# Patient Record
Sex: Female | Born: 1955 | Race: White | Hispanic: No | Marital: Single | Smoking: Current every day smoker
Health system: Southern US, Community
[De-identification: ages and names within clinical notes are randomized; demographics above are authoritative.]

## PROBLEM LIST (undated history)

## (undated) DIAGNOSIS — J449 Chronic obstructive pulmonary disease, unspecified: Secondary | ICD-10-CM

## (undated) DIAGNOSIS — I10 Essential (primary) hypertension: Secondary | ICD-10-CM

## (undated) HISTORY — PX: BACK SURGERY: SHX140

## (undated) HISTORY — PX: APPENDECTOMY: SHX54

---

## 2017-07-25 ENCOUNTER — Encounter (HOSPITAL_BASED_OUTPATIENT_CLINIC_OR_DEPARTMENT_OTHER): Payer: Self-pay | Admitting: Emergency Medicine

## 2017-07-25 ENCOUNTER — Emergency Department (HOSPITAL_BASED_OUTPATIENT_CLINIC_OR_DEPARTMENT_OTHER): Payer: Self-pay

## 2017-07-25 ENCOUNTER — Other Ambulatory Visit: Payer: Self-pay

## 2017-07-25 ENCOUNTER — Emergency Department (HOSPITAL_BASED_OUTPATIENT_CLINIC_OR_DEPARTMENT_OTHER)
Admission: EM | Admit: 2017-07-25 | Discharge: 2017-07-25 | Disposition: A | Discharge status: Home / Self Care | Payer: Self-pay | Attending: Emergency Medicine | Admitting: Emergency Medicine

## 2017-07-25 DIAGNOSIS — I10 Essential (primary) hypertension: Secondary | ICD-10-CM | POA: Insufficient documentation

## 2017-07-25 DIAGNOSIS — F17210 Nicotine dependence, cigarettes, uncomplicated: Secondary | ICD-10-CM | POA: Insufficient documentation

## 2017-07-25 DIAGNOSIS — K5792 Diverticulitis of intestine, part unspecified, without perforation or abscess without bleeding: Secondary | ICD-10-CM | POA: Insufficient documentation

## 2017-07-25 DIAGNOSIS — J449 Chronic obstructive pulmonary disease, unspecified: Secondary | ICD-10-CM | POA: Insufficient documentation

## 2017-07-25 HISTORY — DX: Essential (primary) hypertension: I10

## 2017-07-25 HISTORY — DX: Chronic obstructive pulmonary disease, unspecified: J44.9

## 2017-07-25 LAB — COMPREHENSIVE METABOLIC PANEL
ALK PHOS: 69 U/L (ref 38–126)
ALT: 8 U/L — AB (ref 14–54)
AST: 13 U/L — ABNORMAL LOW (ref 15–41)
Albumin: 3.1 g/dL — ABNORMAL LOW (ref 3.5–5.0)
Anion gap: 6 (ref 5–15)
BUN: 11 mg/dL (ref 6–20)
CALCIUM: 8.2 mg/dL — AB (ref 8.9–10.3)
CO2: 23 mmol/L (ref 22–32)
CREATININE: 0.71 mg/dL (ref 0.44–1.00)
Chloride: 102 mmol/L (ref 101–111)
GFR calc non Af Amer: >60 mL/min (ref >60)
Glucose, Bld: 107 mg/dL — ABNORMAL HIGH (ref 65–99)
Potassium: 3.6 mmol/L (ref 3.5–5.1)
SODIUM: 131 mmol/L — AB (ref 135–145)
Total Bilirubin: 0.7 mg/dL (ref 0.3–1.2)
Total Protein: 6.2 g/dL — ABNORMAL LOW (ref 6.5–8.1)

## 2017-07-25 LAB — URINALYSIS, ROUTINE W REFLEX MICROSCOPIC
Bilirubin Urine: NEGATIVE
Glucose, UA: NEGATIVE mg/dL
Hgb urine dipstick: NEGATIVE
Ketones, ur: NEGATIVE mg/dL
LEUKOCYTES UA: NEGATIVE
NITRITE: NEGATIVE
Protein, ur: NEGATIVE mg/dL
pH: 6 (ref 5.0–8.0)

## 2017-07-25 LAB — CBC WITH DIFFERENTIAL/PLATELET
Basophils Absolute: 0 10*3/uL (ref 0.0–0.1)
Basophils Relative: 0 %
EOS ABS: 0.1 10*3/uL (ref 0.0–0.7)
Eosinophils Relative: 0 %
HCT: 45.7 % (ref 36.0–46.0)
Hemoglobin: 15.6 g/dL — ABNORMAL HIGH (ref 12.0–15.0)
LYMPHS ABS: 1.8 10*3/uL (ref 0.7–4.0)
LYMPHS PCT: 11 %
MCH: 30.6 pg (ref 26.0–34.0)
MCHC: 34.1 g/dL (ref 30.0–36.0)
MCV: 89.8 fL (ref 78.0–100.0)
Monocytes Absolute: 1.5 10*3/uL — ABNORMAL HIGH (ref 0.1–1.0)
Monocytes Relative: 9 %
NEUTROS PCT: 80 %
Neutro Abs: 13.4 10*3/uL — ABNORMAL HIGH (ref 1.7–7.7)
Platelets: 286 10*3/uL (ref 150–400)
RBC: 5.09 MIL/uL (ref 3.87–5.11)
RDW: 11.9 % (ref 11.5–15.5)
WBC: 16.8 10*3/uL — AB (ref 4.0–10.5)

## 2017-07-25 MED ORDER — CIPROFLOXACIN HCL 500 MG PO TABS
500.0000 mg | ORAL_TABLET | Freq: Once | ORAL | Status: AC
  Administered 2017-07-25: 500 mg via ORAL
  Filled 2017-07-25: qty 1

## 2017-07-25 MED ORDER — METRONIDAZOLE 500 MG PO TABS: 500.0000 mg | ORAL_TABLET | Freq: Two times a day (BID) | ORAL | 0 refills | Status: AC

## 2017-07-25 MED ORDER — MORPHINE SULFATE (PF) 4 MG/ML IV SOLN
4.0000 mg | Freq: Once | INTRAVENOUS | Status: AC
  Administered 2017-07-25: 4 mg via INTRAVENOUS
  Filled 2017-07-25: qty 1

## 2017-07-25 MED ORDER — IOPAMIDOL (ISOVUE-300) INJECTION 61%
100.0000 mL | Freq: Once | INTRAVENOUS | Status: AC | PRN
  Administered 2017-07-25: 100 mL via INTRAVENOUS

## 2017-07-25 MED ORDER — CIPROFLOXACIN HCL 500 MG PO TABS: 500.0000 mg | ORAL_TABLET | Freq: Two times a day (BID) | ORAL | 0 refills | Status: AC

## 2017-07-25 MED ORDER — METRONIDAZOLE 500 MG PO TABS
500.0000 mg | ORAL_TABLET | Freq: Once | ORAL | Status: AC
  Administered 2017-07-25: 500 mg via ORAL
  Filled 2017-07-25: qty 1

## 2017-07-25 MED ORDER — SODIUM CHLORIDE 0.9 % IV BOLUS (SEPSIS)
1000.0000 mL | Freq: Once | INTRAVENOUS | Status: AC
  Administered 2017-07-25: 1000 mL via INTRAVENOUS

## 2017-07-25 MED ORDER — HYDROCODONE-ACETAMINOPHEN 5-325 MG PO TABS: 1.0000 | ORAL_TABLET | Freq: Four times a day (QID) | ORAL | 0 refills | Status: AC | PRN

## 2017-07-25 NOTE — ED Triage Notes (Signed)
Pt reports LLQ abd pain x 2 days. Has been taking super beets dietary supplement and unsure if related. Sent from [facility].

## 2017-07-25 NOTE — ED Provider Notes (Signed)
[facility] EMERGENCY DEPARTMENT Provider Note   CSN: [id number] Arrival date & time: 07/25/17  1831     History   Chief Complaint Chief Complaint  Patient presents with  . Abdominal Pain    HPI Lisa Vance is a 61 y.o. female history of COPD, hypertension here presenting with left lower quadrant pain for the last 2 days.  Patient states that several days ago she started super beets supplement and had some LLQ pain.  Dates that pain is crampy with no radiation.  Associated with some nausea but no vomiting.  This morning she developed a fever 101 at home.  To urgent care was noted to have severe left lower quadrant tenderness and was sent here for evaluation.  Denies any urinary symptoms but was noted to have some microscopic hematuria at urgent care.  No history of kidney stones or diverticulitis.   The history is provided by the patient.    Past Medical History:  Diagnosis Date  . COPD (chronic obstructive pulmonary disease) (HCC)   . Hypertension     There are no active problems to display for this patient.   Past Surgical History:  Procedure Laterality Date  . APPENDECTOMY    . BACK SURGERY      OB History    No data available       Home Medications    Prior to Admission medications   Not on File    Family History No family history on file.  Social History Social History   Tobacco Use  . Smoking status: Current Every Day Smoker  . Smokeless tobacco: Never Used  Substance Use Topics  . Alcohol use: No    Frequency: Never  . Drug use: No     Allergies   Patient has no known allergies.   Review of Systems Review of Systems  Gastrointestinal: Positive for abdominal pain.  All other systems reviewed and are negative.    Physical Exam Updated Vital Signs BP 127/86 (BP Location: Left Arm)   Pulse (!) 107   Temp 99 F (37.2 C) (Oral)   Resp 18   Ht 5\' 2"  (1.575 m)   Wt 65.8 kg (145 lb)   SpO2 100%   BMI 26.52 kg/m    Physical Exam  Constitutional: She appears well-developed.  HENT:  Head: Normocephalic.  MM slightly dry   Eyes: Pupils are equal, round, and reactive to light.  Cardiovascular: Normal rate, regular rhythm and normal heart sounds.  Pulmonary/Chest: Effort normal and breath sounds normal.  Minimal wheezing, no crackles   Abdominal: Normal appearance and bowel sounds are normal. There is tenderness in the left lower quadrant.  LLQ tenderness   Neurological: She is alert.  Skin: Skin is warm.  Psychiatric: She has a normal mood and affect.  Nursing note and vitals reviewed.    ED Treatments / Results  Labs (all labs ordered are listed, but only abnormal results are displayed) Labs Reviewed  URINALYSIS, ROUTINE W REFLEX MICROSCOPIC - Abnormal; Notable for the following components:      Result Value   Specific Gravity, Urine <1.005 (*)    All other components within normal limits  CBC WITH DIFFERENTIAL/PLATELET - Abnormal; Notable for the following components:   WBC 16.8 (*)    Hemoglobin 15.6 (*)    Neutro Abs 13.4 (*)    Monocytes Absolute 1.5 (*)    All other components within normal limits  COMPREHENSIVE METABOLIC PANEL - Abnormal; Notable for the following components:  Sodium 131 (*)    Glucose, Bld 107 (*)    Calcium 8.2 (*)    Total Protein 6.2 (*)    Albumin 3.1 (*)    AST 13 (*)    ALT 8 (*)    All other components within normal limits    EKG  EKG Interpretation None       Radiology Ct Abdomen Pelvis W Contrast  Result Date: 07/25/2017 CLINICAL DATA:  Left lower quadrant pain x1 week. EXAM: CT ABDOMEN AND PELVIS WITH CONTRAST TECHNIQUE: Multidetector CT imaging of the abdomen and pelvis was performed using the standard protocol following bolus administration of intravenous contrast. CONTRAST:  100mL ISOVUE-300 IOPAMIDOL (ISOVUE-300) INJECTION 61% COMPARISON:  None. FINDINGS: Lower chest: Normal heart size.  Clear lung bases. Hepatobiliary: No focal liver  abnormality is seen. No gallstones, gallbladder wall thickening, or biliary dilatation. There appears to be some minimal dependent biliary sludge within the gallbladder accounting for faint hyperdensities noted within. Pancreas: Unremarkable. No pancreatic ductal dilatation or surrounding inflammatory changes. Spleen: Normal in size without focal abnormality. Adrenals/Urinary Tract: Normal bilateral adrenal glands. Mild cortical scarring within both lower poles and left interpolar aspect of the kidneys. No obstructive uropathy. Tiny too small to characterize hypodensities may reflect small renal cysts bilaterally. No hydroureteronephrosis. Normal bladder. Stomach/Bowel: Pericolonic inflammation surrounding colonic diverticula along the distal descending and proximal sigmoid colon in keeping with acute diverticulitis is noted. No abscess, free air nor bowel obstruction. The stomach and small intestine are unremarkable. Appendectomy by report. Vascular/Lymphatic: Aortoiliac atherosclerosis.  No lymphadenopathy. Reproductive: Uterus and bilateral adnexa are visualized. A few benign-appearing calcifications are noted about the ovaries. Other: No abdominal wall hernia or abnormality. Trace free fluid in the cul-de-sac likely physiologic. Musculoskeletal: Multilevel degenerative disc space narrowing and posterior marginal osteophytes along the lumbar spine consistent lumbar spondylosis. No acute osseous abnormality. IMPRESSION: 1. Acute distal descending and proximal sigmoid diverticulitis without complicating features. Mild-to-moderate pericolonic fatty inflammation is noted. 2. Bilateral renal cortical scarring without acute abnormality. 3. Minimal biliary sludge within the gallbladder without secondary signs of acute cholecystitis. 4. Lumbar spondylosis. Electronically Signed   By: Tollie Ethavid  Kwon M.D.   On: 07/25/2017 20:24    Procedures Procedures (including critical care time)  Medications Ordered in  ED Medications  ciprofloxacin (CIPRO) tablet 500 mg (not administered)  metroNIDAZOLE (FLAGYL) tablet 500 mg (not administered)  sodium chloride 0.9 % bolus 1,000 mL (1,000 mLs Intravenous New Bag/Given 07/25/17 1929)  morphine 4 MG/ML injection 4 mg (4 mg Intravenous Given 07/25/17 1929)  iopamidol (ISOVUE-300) 61 % injection 100 mL (100 mLs Intravenous Contrast Given 07/25/17 2000)     Initial Impression / Assessment and Plan / ED Course  I have reviewed the triage vital signs and the nursing notes.  Pertinent labs & imaging results that were available during my care of the patient were reviewed by me and considered in my medical decision making (see chart for details).    Lisa Vance is a 61 y.o. female here with LLQ pain for several days, fever. Likely diverticulitis. Will get labs, UA, CT ab/pel.   8:57 PM WBC 16. CT showed uncomplicated diverticulitis. Given cipro/flagyl, will refer to GI for colonoscopy. Will give vicodin for pain.     Final Clinical Impressions(s) / ED Diagnoses   Final diagnoses:  None    ED Discharge Orders    None       Charlynne PanderYao, David Hsienta, MD 07/25/17 [phone]

## 2017-07-25 NOTE — Discharge Instructions (Signed)
Take cipro, flagyl for diverticulitis.   Take motrin for pain.   Take vicodin for severe pain.   See your doctor. Consider colonoscopy with GI doctor   Return to ER if you have worse abdominal pain, vomiting, fever.

## 2019-08-07 IMAGING — CT CT ABD-PELV W/ CM
2 of 5 series · 16 of 46 positions shown, 18 images · IV contrast (iopamidol)
Comparison: None.

CLINICAL DATA: Left lower quadrant pain x1 week.

EXAM:
CT ABDOMEN AND PELVIS WITH CONTRAST
TECHNIQUE: Multidetector CT imaging of the abdomen and pelvis was performed
using the standard protocol following bolus administration of
intravenous contrast.
CONTRAST:  100mL PNFLSC-XNN IOPAMIDOL (PNFLSC-XNN) INJECTION 61%

[Series 2: axial st · axial · 0.89mm/px · z∈[-430,-60]mm · 13 of 84 slices shown, 15 images]
[im 5/84  soft-tissue]
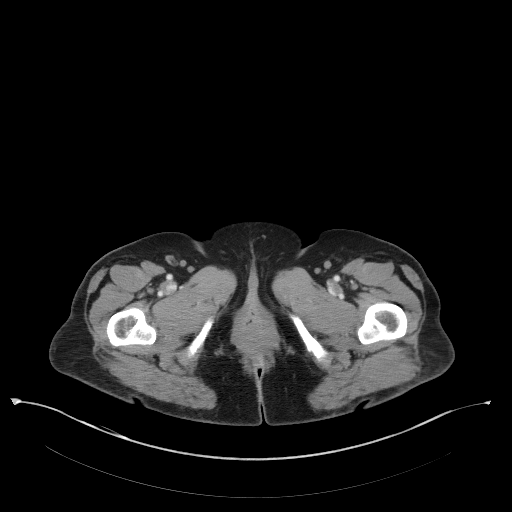
[im 5/84  bone]
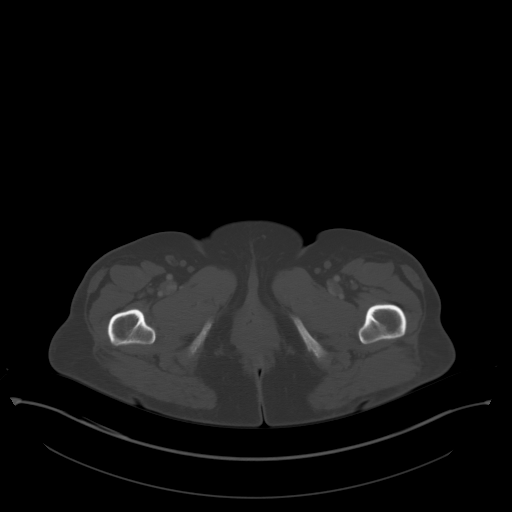
[im 14/84  soft-tissue]
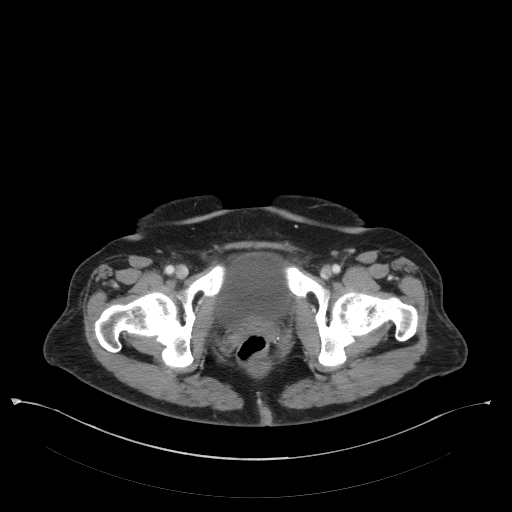
[im 18/84  soft-tissue]
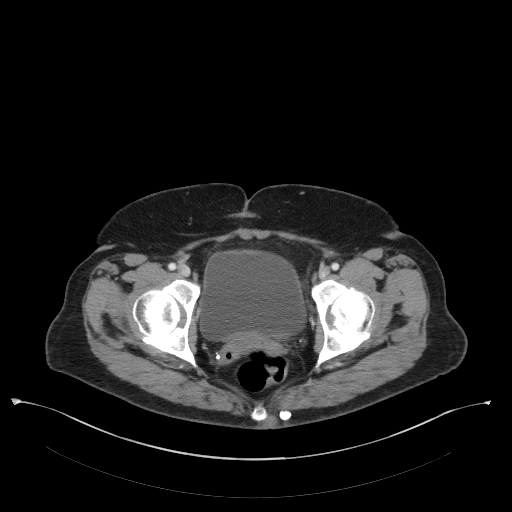
[im 22/84  soft-tissue]
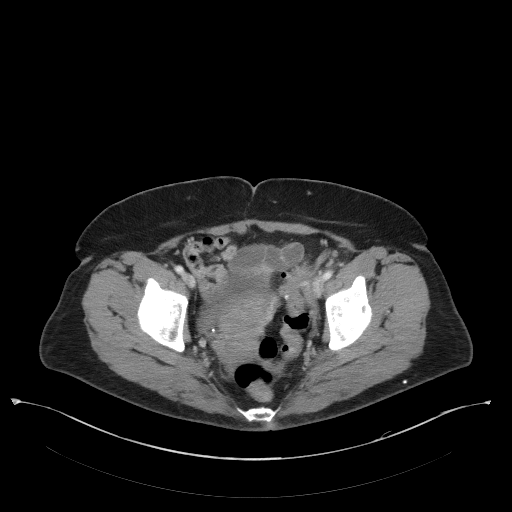
[im 31/84  soft-tissue]
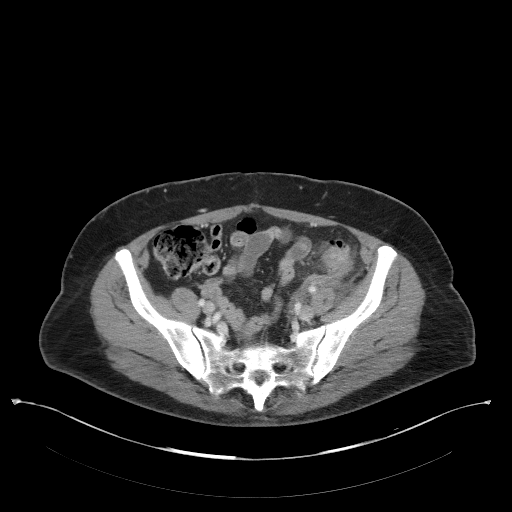
[im 35/84  soft-tissue]
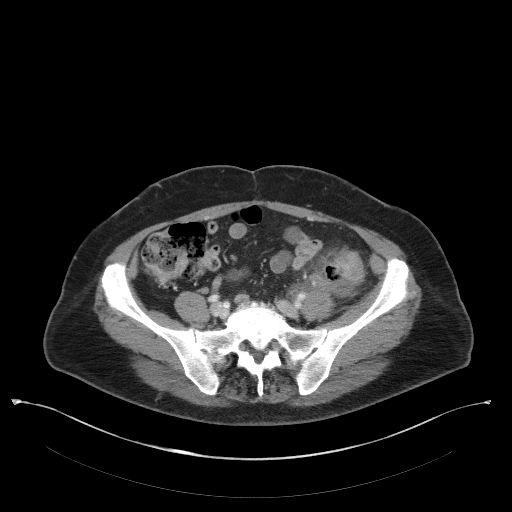
[im 44/84  soft-tissue]
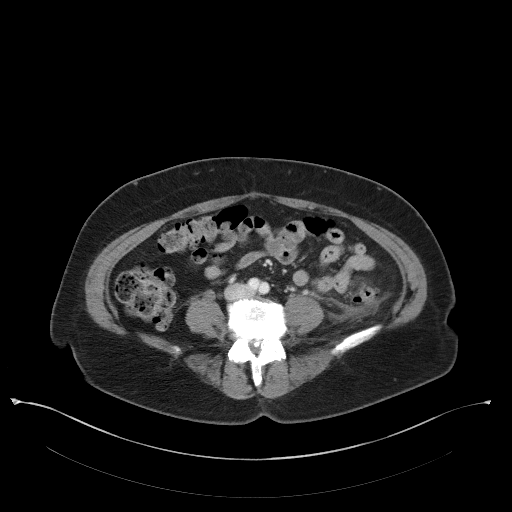
[im 49/84  soft-tissue]
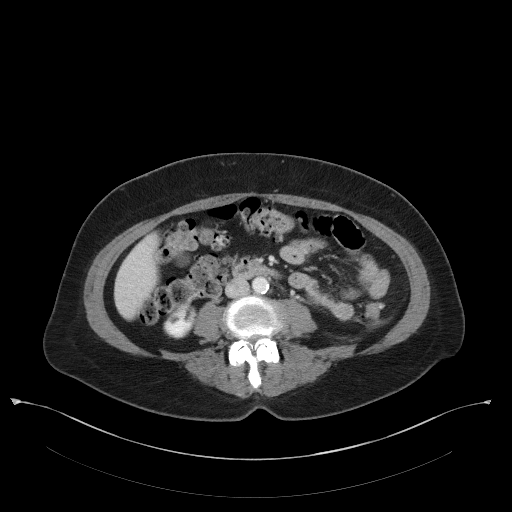
[im 53/84  soft-tissue]
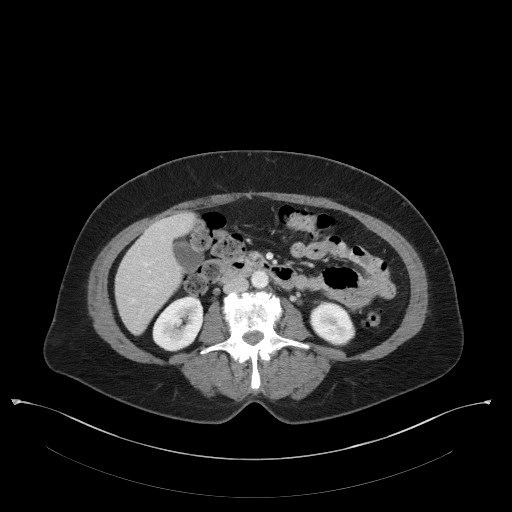
[im 53/84  bone]
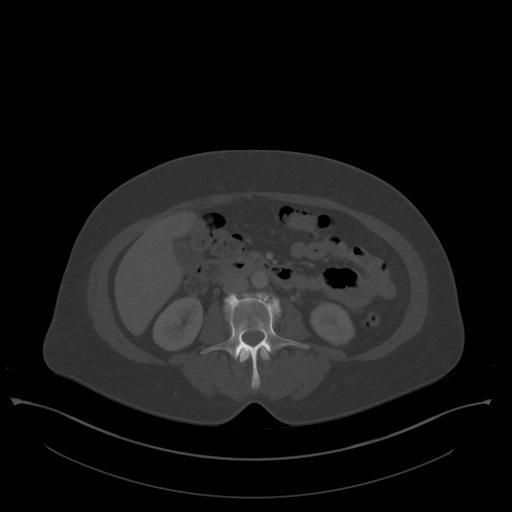
[im 62/84  soft-tissue]
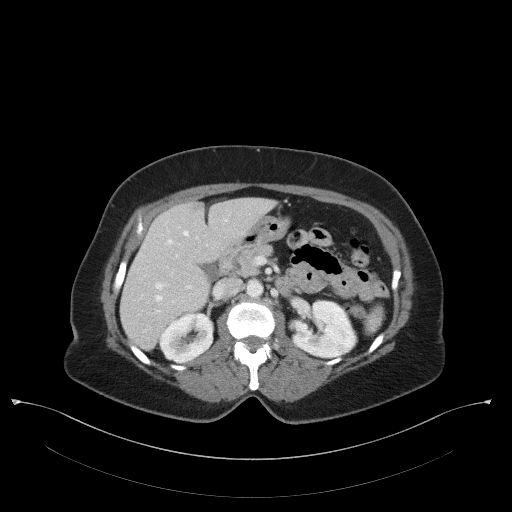
[im 66/84  soft-tissue]
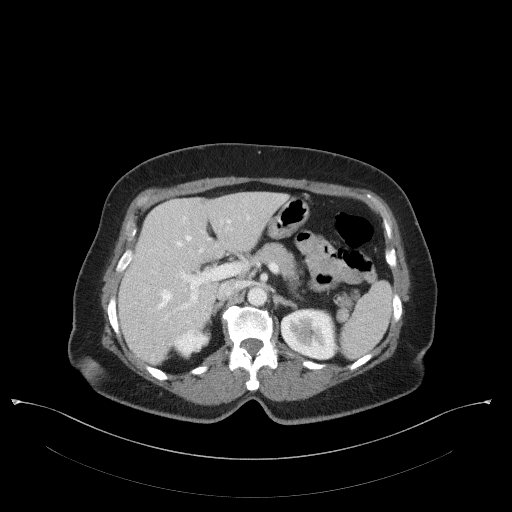
[im 70/84  soft-tissue]
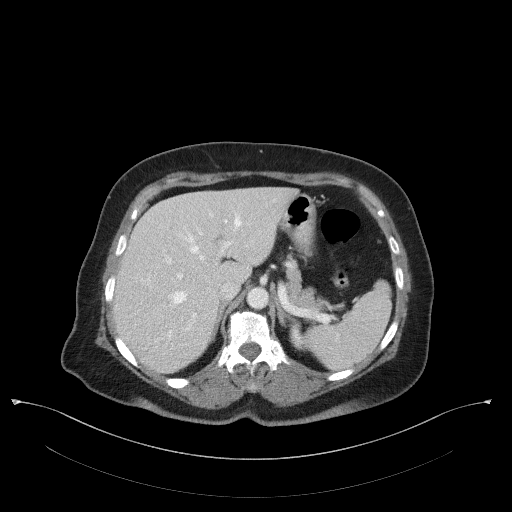
[im 79/84  soft-tissue]
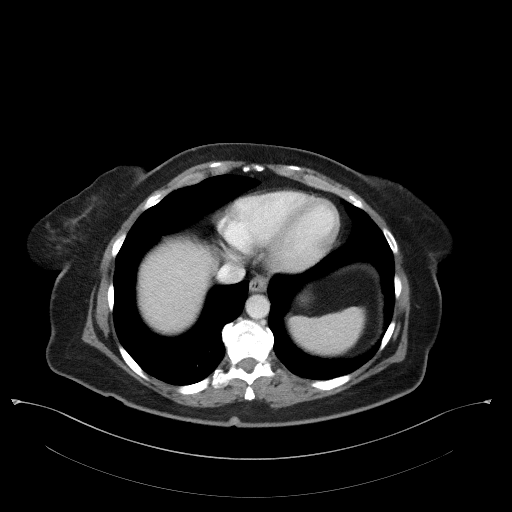

[Series 5: coronal st · coronal · 0.77mm/px · 3 of 85 slices shown]
[im 29/85  soft-tissue]
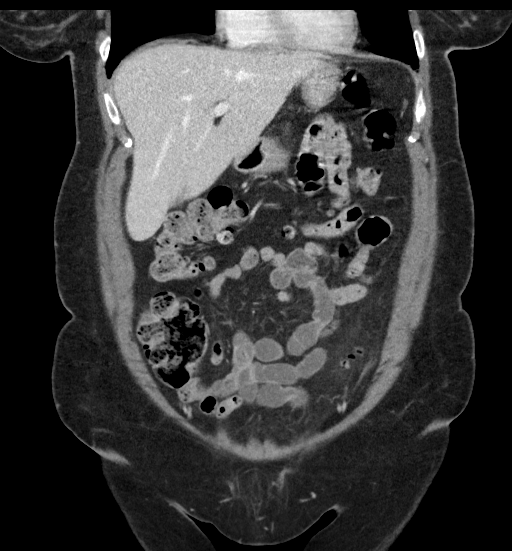
[im 38/85  soft-tissue]
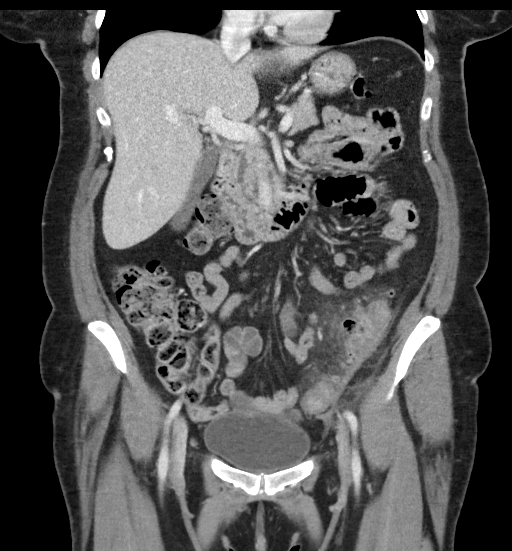
[im 47/85  soft-tissue]
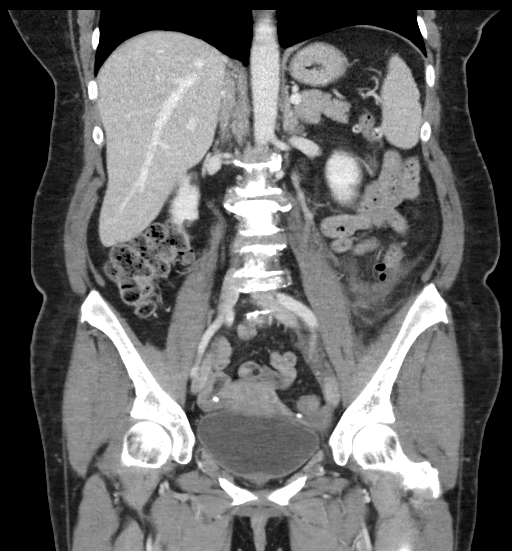

[16 of 46 positions shown; findings below may reference images not displayed]

FINDINGS: Lower chest: Normal heart size.  Clear lung bases.

Hepatobiliary: No focal liver abnormality is seen. No gallstones,
gallbladder wall thickening, or biliary dilatation. There appears to
be some minimal dependent biliary sludge within the gallbladder
accounting for faint hyperdensities noted within.

Pancreas: Unremarkable. No pancreatic ductal dilatation or
surrounding inflammatory changes.

Spleen: Normal in size without focal abnormality.

Adrenals/Urinary Tract: Normal bilateral adrenal glands. Mild
cortical scarring within both lower poles and left interpolar aspect
of the kidneys. No obstructive uropathy. Tiny too small to
characterize hypodensities may reflect small renal cysts
bilaterally. No hydroureteronephrosis. Normal bladder.

Stomach/Bowel: Pericolonic inflammation surrounding colonic
diverticula along the distal descending and proximal sigmoid colon
in keeping with acute diverticulitis is noted. No abscess, free air
nor bowel obstruction. The stomach and small intestine are
unremarkable. Appendectomy by report.

Vascular/Lymphatic: Aortoiliac atherosclerosis.  No lymphadenopathy.

Reproductive: Uterus and bilateral adnexa are visualized. A few
benign-appearing calcifications are noted about the ovaries.

Other: No abdominal wall hernia or abnormality. Trace free fluid in
the cul-de-sac likely physiologic.

Musculoskeletal: Multilevel degenerative disc space narrowing and
posterior marginal osteophytes along the lumbar spine consistent
lumbar spondylosis. No acute osseous abnormality.
IMPRESSION: 1. Acute distal descending and proximal sigmoid diverticulitis
without complicating features. Mild-to-moderate pericolonic fatty
inflammation is noted.
2. Bilateral renal cortical scarring without acute abnormality.
3. Minimal biliary sludge within the gallbladder without secondary
signs of acute cholecystitis.
4. Lumbar spondylosis.
